# Patient Record
Sex: Female | Born: 1990 | Race: Black or African American | Hispanic: No | Marital: Single | Smoking: Never smoker
Health system: Southern US, Community
[De-identification: ages and names within clinical notes are randomized; demographics above are authoritative.]

## PROBLEM LIST (undated history)

## (undated) DIAGNOSIS — D649 Anemia, unspecified: Secondary | ICD-10-CM

## (undated) DIAGNOSIS — D259 Leiomyoma of uterus, unspecified: Secondary | ICD-10-CM

## (undated) DIAGNOSIS — B977 Papillomavirus as the cause of diseases classified elsewhere: Secondary | ICD-10-CM

## (undated) DIAGNOSIS — O021 Missed abortion: Secondary | ICD-10-CM

## (undated) HISTORY — DX: Leiomyoma of uterus, unspecified: D25.9

## (undated) HISTORY — PX: NO PAST SURGERIES: SHX2092

## (undated) HISTORY — DX: Papillomavirus as the cause of diseases classified elsewhere: B97.7

---
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
## 2011-11-05 ENCOUNTER — Other Ambulatory Visit: Payer: Self-pay | Admitting: Family Medicine

## 2011-11-05 ENCOUNTER — Telehealth: Payer: Self-pay

## 2011-11-05 NOTE — Telephone Encounter (Signed)
[facility] notifying patient that she needs office visit/eval before any medication can be called in.  Unable to refill w/o OV.  CB if questions.

## 2011-11-05 NOTE — Telephone Encounter (Signed)
Pt is requesting refill on Rx for yeast infection.

## 2012-03-26 ENCOUNTER — Other Ambulatory Visit: Payer: Self-pay | Admitting: Family Medicine

## 2012-03-30 ENCOUNTER — Telehealth: Payer: Self-pay

## 2012-03-30 NOTE — Telephone Encounter (Signed)
[facility] called for refill auth for Fluconazol. Please review and call back.

## 2012-03-31 NOTE — Telephone Encounter (Signed)
Should patient have OV first?

## 2012-03-31 NOTE — Telephone Encounter (Signed)
Please advise the patient that she needs and office visit for this.

## 2012-03-31 NOTE — Telephone Encounter (Signed)
Pharmacy notified.

## 2014-10-03 HISTORY — PX: COLONOSCOPY: SHX174

## 2020-08-03 ENCOUNTER — Encounter (HOSPITAL_COMMUNITY): Payer: Self-pay

## 2020-08-03 ENCOUNTER — Emergency Department (HOSPITAL_COMMUNITY)
Admission: EM | Admit: 2020-08-03 | Discharge: 2020-08-03 | Disposition: A | Discharge status: Home / Self Care | Payer: 59 | Attending: Emergency Medicine | Admitting: Emergency Medicine

## 2020-08-03 ENCOUNTER — Other Ambulatory Visit: Payer: Self-pay

## 2020-08-03 DIAGNOSIS — S8002XA Contusion of left knee, initial encounter: Secondary | ICD-10-CM | POA: Insufficient documentation

## 2020-08-03 DIAGNOSIS — Y9301 Activity, walking, marching and hiking: Secondary | ICD-10-CM | POA: Insufficient documentation

## 2020-08-03 DIAGNOSIS — S59902A Unspecified injury of left elbow, initial encounter: Secondary | ICD-10-CM | POA: Diagnosis present

## 2020-08-03 DIAGNOSIS — Y9289 Other specified places as the place of occurrence of the external cause: Secondary | ICD-10-CM | POA: Insufficient documentation

## 2020-08-03 DIAGNOSIS — S0993XA Unspecified injury of face, initial encounter: Secondary | ICD-10-CM | POA: Insufficient documentation

## 2020-08-03 DIAGNOSIS — S5002XA Contusion of left elbow, initial encounter: Secondary | ICD-10-CM | POA: Diagnosis not present
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
## 2022-07-12 DIAGNOSIS — B977 Papillomavirus as the cause of diseases classified elsewhere: Secondary | ICD-10-CM | POA: Insufficient documentation

## 2022-10-26 LAB — OB RESULTS CONSOLE HGB/HCT, BLOOD
HCT: 35 (ref 29–41)
Hemoglobin: 10.5

## 2022-10-26 LAB — HEPATITIS C ANTIBODY: HCV Ab: NEGATIVE

## 2022-10-26 LAB — OB RESULTS CONSOLE ANTIBODY SCREEN: Antibody Screen: NEGATIVE

## 2022-10-26 LAB — OB RESULTS CONSOLE GC/CHLAMYDIA
Chlamydia: NEGATIVE
Neisseria Gonorrhea: NEGATIVE

## 2022-10-26 LAB — OB RESULTS CONSOLE HEPATITIS B SURFACE ANTIGEN: Hepatitis B Surface Ag: NEGATIVE

## 2022-10-26 LAB — OB RESULTS CONSOLE RPR: RPR: NONREACTIVE

## 2022-10-26 LAB — OB RESULTS CONSOLE ABO/RH: RH Type: POSITIVE

## 2022-10-26 LAB — OB RESULTS CONSOLE PLATELET COUNT: Platelets: 192

## 2022-10-26 LAB — OB RESULTS CONSOLE HIV ANTIBODY (ROUTINE TESTING): HIV: NONREACTIVE

## 2022-11-30 ENCOUNTER — Encounter (HOSPITAL_BASED_OUTPATIENT_CLINIC_OR_DEPARTMENT_OTHER): Payer: PRIVATE HEALTH INSURANCE

## 2022-11-30 ENCOUNTER — Encounter: Payer: Self-pay | Admitting: *Deleted

## 2022-11-30 ENCOUNTER — Encounter: Payer: Self-pay | Admitting: Certified Nurse Midwife

## 2022-11-30 LAB — CYTOLOGY - PAP: Pap: NEGATIVE

## 2022-11-30 LAB — HCG, BETA SUBUNIT, QN (SERIAL): hCG,Beta Subunit,Qual,Serum: 12884

## 2022-12-01 ENCOUNTER — Ambulatory Visit (INDEPENDENT_AMBULATORY_CARE_PROVIDER_SITE_OTHER): Payer: Medicaid Other | Admitting: Obstetrics & Gynecology

## 2022-12-01 ENCOUNTER — Encounter (HOSPITAL_BASED_OUTPATIENT_CLINIC_OR_DEPARTMENT_OTHER): Payer: Self-pay

## 2022-12-01 ENCOUNTER — Ambulatory Visit (INDEPENDENT_AMBULATORY_CARE_PROVIDER_SITE_OTHER): Payer: Medicaid Other

## 2022-12-01 ENCOUNTER — Ambulatory Visit (INDEPENDENT_AMBULATORY_CARE_PROVIDER_SITE_OTHER): Payer: Medicaid Other | Admitting: *Deleted

## 2022-12-01 VITALS — BP 106/74 | HR 83 | Ht 68.0 in | Wt 198.2 lb

## 2022-12-01 DIAGNOSIS — D251 Intramural leiomyoma of uterus: Secondary | ICD-10-CM | POA: Diagnosis not present

## 2022-12-01 DIAGNOSIS — Z3401 Encounter for supervision of normal first pregnancy, first trimester: Secondary | ICD-10-CM

## 2022-12-01 DIAGNOSIS — O3680X0 Pregnancy with inconclusive fetal viability, not applicable or unspecified: Secondary | ICD-10-CM | POA: Diagnosis not present

## 2022-12-01 DIAGNOSIS — Z3A10 10 weeks gestation of pregnancy: Secondary | ICD-10-CM

## 2022-12-01 DIAGNOSIS — Z3400 Encounter for supervision of normal first pregnancy, unspecified trimester: Secondary | ICD-10-CM | POA: Insufficient documentation

## 2022-12-01 MED ORDER — BLOOD PRESSURE KIT DEVI: 1.0000 | Freq: Once | 0 refills | Status: AC

## 2022-12-01 MED ORDER — GOJJI WEIGHT SCALE MISC: 1.0000 | 0 refills | Status: AC

## 2022-12-01 NOTE — Progress Notes (Signed)
New OB Intake  Pt presents to office for New OB intake  I explained I am completing New OB Intake today. We discussed EDD of 06/21/23 that is based on LMP of 09/14/22. Pt is G1/P0. I reviewed her allergies, medications, Medical/Surgical/OB history, and appropriate screenings. I informed her of [facility] services. Based on history, this is a low risk pregnancy.  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 Patient given information for [facility] Healthy Baby website for more information about Women[facility] and [place]. Patient is interested in water birth. Offered upcoming OB visit with CNM to discuss further.  MyChart/Babyscripts MyChart access verified. I explained pt will have some visits in office and some virtually. Babyscripts instructions given and order placed. Patient verifies receipt of registration text/e-mail.   Blood Pressure Cuff/Weight Scale Blood pressure cuff ordered for patient to pick-up from [employer]. Explained after first prenatal appt pt will check weekly and document in 76. Patient does not have weight scale; order sent to [place], patient may track weight weekly in Babyscripts.  Anatomy US Explained first scheduled [place] will be around 19 weeks. Anatomy US scheduled for 01/25/23 at 245. Pt notified to arrive at 230.  Labs Discussed Johnsie Cancel genetic screening with patient. Would like both Panorama and Horizon drawn at new OB visit. Routine prenatal labs were done at Dr. Garwin Brothers office   Social Determinants of [place]: Patient denies food insecurity. WIC Referral: Patient is interested in referral to [facility]. Information for setting up [facility] given to patient. Transportation: Patient denies transportation needs. Childcare: Discussed no children allowed at ultrasound appointments.      Blenda Nicely, RN 12/01/2022  2:43 PM

## 2022-12-02 ENCOUNTER — Inpatient Hospital Stay (HOSPITAL_COMMUNITY): Payer: Medicaid Other

## 2022-12-02 ENCOUNTER — Inpatient Hospital Stay (HOSPITAL_COMMUNITY)
Admission: AD | Admit: 2022-12-02 | Discharge: 2022-12-02 | Disposition: A | Discharge status: Home / Self Care | Payer: Medicaid Other | Attending: Obstetrics & Gynecology | Admitting: Obstetrics & Gynecology

## 2022-12-02 DIAGNOSIS — Z3A10 10 weeks gestation of pregnancy: Secondary | ICD-10-CM | POA: Diagnosis not present

## 2022-12-02 DIAGNOSIS — D259 Leiomyoma of uterus, unspecified: Secondary | ICD-10-CM | POA: Insufficient documentation

## 2022-12-02 DIAGNOSIS — O3411 Maternal care for benign tumor of corpus uteri, first trimester: Secondary | ICD-10-CM | POA: Diagnosis present

## 2022-12-02 DIAGNOSIS — O021 Missed abortion: Secondary | ICD-10-CM | POA: Insufficient documentation

## 2022-12-02 HISTORY — DX: Missed abortion: O02.1

## 2022-12-02 LAB — URINALYSIS, ROUTINE W REFLEX MICROSCOPIC
Bilirubin Urine: NEGATIVE
Glucose, UA: NEGATIVE mg/dL
Hgb urine dipstick: NEGATIVE
Ketones, ur: NEGATIVE mg/dL
Leukocytes,Ua: NEGATIVE
Nitrite: NEGATIVE
Protein, ur: NEGATIVE mg/dL
Specific Gravity, Urine: 1.015 (ref 1.005–1.030)
pH: 5 (ref 5.0–8.0)

## 2022-12-02 LAB — WET PREP, GENITAL
Sperm: NONE SEEN
Trich, Wet Prep: NONE SEEN
WBC, Wet Prep HPF POC: <10 (ref <10)
Yeast Wet Prep HPF POC: NONE SEEN

## 2022-12-02 MED ORDER — OXYCODONE-ACETAMINOPHEN 5-325 MG PO TABS: 1.0000 | ORAL_TABLET | Freq: Four times a day (QID) | ORAL | 0 refills | Status: DC | PRN

## 2022-12-02 MED ORDER — IBUPROFEN 800 MG PO TABS: 800.0000 mg | ORAL_TABLET | Freq: Three times a day (TID) | ORAL | 0 refills | Status: AC

## 2022-12-02 NOTE — MAU Note (Signed)
Pt says she has lower abd pain- started today  at  4pm -  Last sex- Mon [facility]- Dr Sabra Heck -  at [facility] -yesterday- U/S- fibroid blocking baby - unable to see FHR -  Plan- is to return on 12-07-2022 for another U/S . No meds - 6/10 pain

## 2022-12-02 NOTE — MAU Provider Note (Signed)
History     CSN: [id number]  Arrival date and time: 12/02/22 1920  Chief Complaint  Patient presents with   Abdominal Pain   HPI  Mackenzie Lee is a 32 y.o. G1P0 at 57w2dwho presents for evaluation of lower abdominal pain. Patient reports cramping across her lower abdomen that started today at 4pm. Patient rates the pain as a 6/10 and has not tried anything for the pain. [facility]'t [facility] because of her fibroid.  She denies any vaginal bleeding and discharge. Denies any constipation, diarrhea or any urinary complaints.  OB History     Gravida  1   Para      Term      Preterm      AB      Living         SAB      IAB      Ectopic      Multiple      Live Births              No past medical history on file.  No past surgical history on file.  Family History  Problem Relation Age of Onset   Cancer Mother 537      breast   Cancer Paternal Grandmother        breast    Social History   Tobacco Use   Smoking status: Never   Smokeless tobacco: Never  Vaping Use   Vaping Use: Never used  Substance Use Topics   Alcohol use: Not Currently   Drug use: Never    Allergies: No Known Allergies  Medications Prior to Admission  Medication Sig Dispense Refill Last Dose   Misc. Devices (GOJJI WEIGHT SCALE) MISC 1 Device by Does not apply route once a week. To monitor weight at home weekly; ICD 10 z34.90 1 each 0    Prenatal Vit-Fe Fumarate-FA (MULTIVITAMIN-PRENATAL) 27-0.8 MG TABS tablet Take 1 tablet by mouth daily at 12 noon.       Review of Systems  Constitutional: Negative.  Negative for fatigue and fever.  HENT: Negative.    Respiratory: Negative.  Negative for shortness of breath.   Cardiovascular: Negative.  Negative for chest pain.  Gastrointestinal:  Positive for abdominal pain. Negative for constipation, diarrhea, nausea and vomiting.  Genitourinary: Negative.  Negative for  dysuria, vaginal bleeding and vaginal discharge.  Neurological: Negative.  Negative for dizziness and headaches.   Physical Exam   Blood pressure 112/70, pulse 84, temperature 98 F (36.7 C), temperature source Oral, resp. rate 16, height '5\' 7"'$  (1.702 m), weight 89.8 kg, last menstrual period 09/14/2022.  Patient Vitals for the past 24 hrs:  BP Temp Temp src Pulse Resp Height Weight  12/02/22 1942 112/70 98 F (36.7 C) Oral 84 16 '5\' 7"'$  (1.702 m) 89.8 kg    Physical Exam Vitals and nursing note reviewed.  Constitutional:      General: She is not in acute distress.    Appearance: She is well-developed.  HENT:     Head: Normocephalic.  Eyes:     Pupils: Pupils are equal, round, and reactive to light.  Cardiovascular:     Rate and Rhythm: Normal rate and regular rhythm.     Heart sounds: Normal heart sounds.  Pulmonary:     Effort: Pulmonary effort is normal. No respiratory distress.     Breath sounds: Normal breath sounds.  Abdominal:  General: Bowel sounds are normal. There is no distension.     Palpations: Abdomen is soft.     Tenderness: There is no abdominal tenderness.  Skin:    General: Skin is warm and dry.  Neurological:     Mental Status: She is alert and oriented to person, place, and time.  Psychiatric:        Mood and Affect: Mood normal.        Behavior: Behavior normal.        Thought Content: Thought content normal.        Judgment: Judgment normal.     MAU Course  Procedures  Results for orders placed or performed during the hospital encounter of 12/02/22 (from the past 24 hour(s))  Wet prep, genital     Status: Abnormal   Collection Time: 12/02/22  8:00 PM  Result Value Ref Range   Yeast Wet Prep HPF POC NONE SEEN NONE SEEN   Trich, Wet Prep NONE SEEN NONE SEEN   Clue Cells Wet Prep HPF POC PRESENT (A) NONE SEEN   WBC, Wet Prep HPF POC <10 <10   Sperm NONE SEEN   Urinalysis, Routine w reflex microscopic -Urine, Clean Catch     Status: Abnormal    Collection Time: 12/02/22  8:02 PM  Result Value Ref Range   Color, Urine YELLOW YELLOW   APPearance HAZY (A) CLEAR   Specific Gravity, Urine 1.015 1.005 - 1.030   pH 5.0 5.0 - 8.0   Glucose, UA NEGATIVE NEGATIVE mg/dL   Hgb urine dipstick NEGATIVE NEGATIVE   Bilirubin Urine NEGATIVE NEGATIVE   Ketones, ur NEGATIVE NEGATIVE mg/dL   Protein, ur NEGATIVE NEGATIVE mg/dL   Nitrite NEGATIVE NEGATIVE   Leukocytes,Ua NEGATIVE NEGATIVE     US OB LESS THAN 14 WEEKS WITH OB TRANSVAGINAL  Result Date: 12/02/2022 CLINICAL DATA:  Abdominal pain EXAM: OBSTETRIC <14 WK [place] AND TRANSVAGINAL OB US TECHNIQUE: Both transabdominal and transvaginal ultrasound examinations were performed for complete evaluation of the gestation as well as the maternal uterus, adnexal regions, and pelvic cul-de-sac. Transvaginal technique was performed to assess early pregnancy. COMPARISON:  None available. FINDINGS: Intrauterine gestational sac: Single Yolk sac:  Not Visualized. Embryo:  Visualized. Cardiac Activity: Not Visualized. Heart Rate: 0  bpm CRL:  31.8 mm   10 w   1 d                  [place] EDC: 06/29/2023 Subchorionic hemorrhage:  None visualized. Maternal uterus/adnexae: The bilateral ovaries appear within normal limits. There is no pelvic free fluid. Anterior uterine fibroid identified measuring 9.1 by 7.5 x 9.2 cm. IMPRESSION: 1. Intrauterine fetal demise measuring 10 weeks 1 day by crown-rump length. 2. Large anterior uterine fibroid measuring 9.2 cm. Electronically Signed   By: Ronney Asters M.D.   On: 12/02/2022 20:48     MDM Labs ordered and reviewed.   CBC, HCG Wet prep and gc/chlamydia O Pos blood type US OB Less 14 weeks with transvaginal  CNM independently reviewed the imaging ordered. Imaging show IUP measuring 10 weeks with no FHR  CNM consulted with Dr. Nelda Marseille regarding presentation and results- MD states CNM can offer cytotec if patient desires but with caution that it will not likely work.   CNM  informed patient of results of ultrasound showing missed AB. Condolences provided and space given for patient to process emotions. CNM discussed options for management including expectant management, cytotec and surgical management. Risks and benefits of each  reviewed at length. Patient desires surgical and verbalized understanding of risks and benefits of this method.   Message sent to scheduler with request for [facility]  Assessment and Plan   1. Missed abortion   2. [redacted] weeks gestation of pregnancy     -Discharge home in stable condition -Rx for percocet and ibuprofen sent to [facility] -Vaginal bleeding and pain precautions discussed -Patient advised to follow-up with [facility] for surgery next week -Patient may return to MAU as needed or if her condition were to change or worsen  Wende Mott, CNM 12/02/2022, 9:01 PM

## 2022-12-04 ENCOUNTER — Encounter (HOSPITAL_BASED_OUTPATIENT_CLINIC_OR_DEPARTMENT_OTHER): Payer: Self-pay | Admitting: Obstetrics & Gynecology

## 2022-12-04 NOTE — Progress Notes (Signed)
GYNECOLOGY  VISIT  CC:   confirmation of pregnancy  HPI: 32 y.o. G1P0 Single Black or [place] American female here for what was scheduled as new ob appt.  Pt has been seen initially by Dr. Garwin Brothers and did have ultrasound around 6 weeks with heart beat.  She was originally being seen for consideration of treatment of fibroid.  Pt reports when ultrasound was done, Dr. Garwin Brothers told her "there was a friend" and that she was pregnant.  No treatment for fibroid could be done although she was considering Sonata.  Denies bleeding.  Had some nausea but that seems to have resolved.  No vaginal bleeding.   Past Medical History:  Diagnosis Date   Fibroid uterus     MEDS:   No current outpatient medications on file prior to visit.   No current facility-administered medications on file prior to visit.    ALLERGIES: Patient has no known allergies.  SH:  non smoker, single  Review of Systems  Constitutional: Negative.   Genitourinary: Negative.     PHYSICAL EXAMINATION:    BP 106/74   Pulse 83   Ht '5\' 8"'$  (1.727 m)   Wt 198 lb 3.2 oz (89.9 kg)   LMP 09/14/2022   BMI 30.14 kg/m     General appearance: alert, cooperative and appears stated age Abdomen: soft, non-tender; bowel sounds normal; no masses,  no organomegaly Lymph:  no inguinal LAD noted  Pelvic: External genitalia:  no lesions              Urethra:  normal appearing urethra with no masses, tenderness or lesions              Bartholins and Skenes: normal                 Vagina: normal appearing vagina with normal color and discharge, no lesions               Bedside ultrasound was performed initially transabdominally.  Large fundal fibroid present.  Gestational sac and fetal pole seen but not well.  Vaginal ultrasound performed.  CRL measured 10 1/7 week.  No FCA noted.  No sound with pulse wave seen.  No evidence of subchorionic hemorrhage.  Ultrasound done in presence of RN, Ezekiel Ina.  Chaperone, Ezekiel Ina, CMA, was  present for exam.  Assessment/Plan: 1. Missed abortion - pt and significant other appropriately upset.  Would like confirmation ultrasound done next week.  This is scheduled for them.  2. Intramural leiomyoma of uterus - large 8+cm fibroid noted on ultrasound today  3.  ASCUS pap with +HR HPV - this is noted in Dr. Harvie Bridge not but I do not have actual results.  Will have pt sign release for pap.  Total time with pt: 29mn.

## 2022-12-05 ENCOUNTER — Encounter (HOSPITAL_BASED_OUTPATIENT_CLINIC_OR_DEPARTMENT_OTHER): Payer: Self-pay | Admitting: Obstetrics and Gynecology

## 2022-12-05 ENCOUNTER — Telehealth: Payer: Self-pay

## 2022-12-05 LAB — GC/CHLAMYDIA PROBE AMP (~~LOC~~) NOT AT ARMC
Chlamydia: NEGATIVE
Comment: NEGATIVE
Comment: NORMAL
Neisseria Gonorrhea: NEGATIVE

## 2022-12-05 NOTE — Progress Notes (Signed)
Spoke w/ via phone for pre-op interview--- pt Lab needs dos----  no             Lab results------ no COVID test -----patient states asymptomatic no test needed Arrive at ------- 0530 on 12-06-2022 NPO after MN  Med rec completed Medications to take morning of surgery ----- none Diabetic medication ----- n/a Patient instructed no nail polish to be worn day of surgery Patient instructed to bring photo id and insurance card day of surgery Patient aware to have Driver (ride ) / caregiver    for 24 hours after surgery -- family, daseer Patient Special Instructions ----- n/a Pre-Op special Istructions ----- case just added on today, orders pending Patient verbalized understanding of instructions that were given at this phone interview. Patient denies shortness of breath, chest pain, fever, cough at this phone interview.

## 2022-12-05 NOTE — Telephone Encounter (Signed)
Called patient, surgery date, time, location, and preop instructions given. Patient expressed understanding.

## 2022-12-05 NOTE — Anesthesia Preprocedure Evaluation (Signed)
Anesthesia Evaluation  Patient identified by MRN, date of birth, ID band Patient awake    Reviewed: Allergy & Precautions, NPO status , Patient's Chart, lab work & pertinent test results  Airway Mallampati: I  TM Distance: >3 FB Neck ROM: Full    Dental no notable dental hx. (+) Teeth Intact, Dental Advisory Given   Pulmonary    Pulmonary exam normal breath sounds clear to auscultation       Cardiovascular negative cardio ROS Normal cardiovascular exam Rhythm:Regular Rate:Normal     Neuro/Psych negative neurological ROS  negative psych ROS   GI/Hepatic Neg liver ROS,,,  Endo/Other  negative endocrine ROS    Renal/GU      Musculoskeletal   Abdominal   Peds  Hematology Lab Results      Component                Value               Date                      HGB                      10.5                10/26/2022                HCT                      35                  10/26/2022                PLT                      192                 10/26/2022              Anesthesia Other Findings   Reproductive/Obstetrics                             Anesthesia Physical Anesthesia Plan  ASA: 2  Anesthesia Plan: General   Post-op Pain Management:    Induction: Intravenous  PONV Risk Score and Plan: Treatment may vary due to age or medical condition, Midazolam and Ondansetron  Airway Management Planned: LMA  Additional Equipment: None  Intra-op Plan:   Post-operative Plan: Extubation in OR  Informed Consent: I have reviewed the patients History and Physical, chart, labs and discussed the procedure including the risks, benefits and alternatives for the proposed anesthesia with the patient or authorized representative who has indicated his/her understanding and acceptance.     Dental advisory given  Plan Discussed with:   Anesthesia Plan Comments: (If <13 wks then LMA GA)        Anesthesia Quick Evaluation

## 2022-12-06 ENCOUNTER — Ambulatory Visit (HOSPITAL_BASED_OUTPATIENT_CLINIC_OR_DEPARTMENT_OTHER): Payer: Medicaid Other | Admitting: Anesthesiology

## 2022-12-06 ENCOUNTER — Other Ambulatory Visit: Payer: Self-pay

## 2022-12-06 ENCOUNTER — Ambulatory Visit (HOSPITAL_BASED_OUTPATIENT_CLINIC_OR_DEPARTMENT_OTHER)
Admission: RE | Admit: 2022-12-06 | Discharge: 2022-12-06 | Disposition: A | Discharge status: Home / Self Care | Payer: Medicaid Other | Attending: Obstetrics and Gynecology | Admitting: Obstetrics and Gynecology

## 2022-12-06 ENCOUNTER — Encounter (HOSPITAL_BASED_OUTPATIENT_CLINIC_OR_DEPARTMENT_OTHER)
Admission: RE | Disposition: A | Discharge status: Home / Self Care | Payer: Self-pay | Source: Home / Self Care | Attending: Obstetrics and Gynecology

## 2022-12-06 ENCOUNTER — Encounter (HOSPITAL_BASED_OUTPATIENT_CLINIC_OR_DEPARTMENT_OTHER): Payer: Self-pay | Admitting: Obstetrics and Gynecology

## 2022-12-06 DIAGNOSIS — O021 Missed abortion: Secondary | ICD-10-CM

## 2022-12-06 DIAGNOSIS — Z3A14 14 weeks gestation of pregnancy: Secondary | ICD-10-CM

## 2022-12-06 DIAGNOSIS — Z3A10 10 weeks gestation of pregnancy: Secondary | ICD-10-CM

## 2022-12-06 DIAGNOSIS — Z01818 Encounter for other preprocedural examination: Secondary | ICD-10-CM

## 2022-12-06 HISTORY — DX: Anemia, unspecified: D64.9

## 2022-12-06 HISTORY — PX: DILATION AND EVACUATION: SHX1459

## 2022-12-06 SURGERY — DILATION AND EVACUATION, UTERUS
Anesthesia: Monitor Anesthesia Care | Site: Uterus

## 2022-12-06 MED ORDER — PROPOFOL 10 MG/ML IV BOLUS
INTRAVENOUS | Status: DC | PRN
  Administered 2022-12-06: 50 mg via INTRAVENOUS
  Administered 2022-12-06: 20 mg via INTRAVENOUS
  Administered 2022-12-06: 200 mg via INTRAVENOUS
  Administered 2022-12-06: 50 mg via INTRAVENOUS

## 2022-12-06 MED ORDER — MIDAZOLAM HCL 2 MG/2ML IJ SOLN
INTRAMUSCULAR | Status: AC
  Filled 2022-12-06: qty 2

## 2022-12-06 MED ORDER — KETOROLAC TROMETHAMINE 30 MG/ML IJ SOLN
INTRAMUSCULAR | Status: AC
  Filled 2022-12-06: qty 1

## 2022-12-06 MED ORDER — FENTANYL CITRATE (PF) 100 MCG/2ML IJ SOLN
INTRAMUSCULAR | Status: AC
  Filled 2022-12-06: qty 2

## 2022-12-06 MED ORDER — LACTATED RINGERS IV SOLN: INTRAVENOUS | Status: DC

## 2022-12-06 MED ORDER — ONDANSETRON HCL 4 MG/2ML IJ SOLN
INTRAMUSCULAR | Status: AC
  Filled 2022-12-06: qty 2

## 2022-12-06 MED ORDER — POVIDONE-IODINE 10 % EX SWAB: 2.0000 | Freq: Once | CUTANEOUS | Status: DC

## 2022-12-06 MED ORDER — HYDROMORPHONE HCL 1 MG/ML IJ SOLN
INTRAMUSCULAR | Status: AC
  Filled 2022-12-06: qty 1

## 2022-12-06 MED ORDER — DEXAMETHASONE SODIUM PHOSPHATE 10 MG/ML IJ SOLN
INTRAMUSCULAR | Status: AC
  Filled 2022-12-06: qty 1

## 2022-12-06 MED ORDER — KETOROLAC TROMETHAMINE 30 MG/ML IJ SOLN: 30.0000 mg | Freq: Once | INTRAMUSCULAR | Status: DC | PRN

## 2022-12-06 MED ORDER — DOXYCYCLINE HYCLATE 100 MG IV SOLR
200.0000 mg | INTRAVENOUS | Status: AC
  Administered 2022-12-06: 200 mg via INTRAVENOUS
  Filled 2022-12-06: qty 200

## 2022-12-06 MED ORDER — PROPOFOL 10 MG/ML IV BOLUS
INTRAVENOUS | Status: AC
  Filled 2022-12-06: qty 20

## 2022-12-06 MED ORDER — ONDANSETRON HCL 4 MG/2ML IJ SOLN: 4.0000 mg | Freq: Once | INTRAMUSCULAR | Status: DC | PRN

## 2022-12-06 MED ORDER — DEXAMETHASONE SODIUM PHOSPHATE 10 MG/ML IJ SOLN
INTRAMUSCULAR | Status: DC | PRN
  Administered 2022-12-06: 10 mg via INTRAVENOUS

## 2022-12-06 MED ORDER — LIDOCAINE-EPINEPHRINE 1 %-1:100000 IJ SOLN
INTRAMUSCULAR | Status: DC | PRN
  Administered 2022-12-06: 20 mL

## 2022-12-06 MED ORDER — KETOROLAC TROMETHAMINE 30 MG/ML IJ SOLN
INTRAMUSCULAR | Status: DC | PRN
  Administered 2022-12-06: 30 mg via INTRAVENOUS

## 2022-12-06 MED ORDER — MIDAZOLAM HCL 5 MG/5ML IJ SOLN
INTRAMUSCULAR | Status: DC | PRN
  Administered 2022-12-06: 2 mg via INTRAVENOUS

## 2022-12-06 MED ORDER — LIDOCAINE 2% (20 MG/ML) 5 ML SYRINGE
INTRAMUSCULAR | Status: DC | PRN
  Administered 2022-12-06: 60 mg via INTRAVENOUS
  Administered 2022-12-06: 100 mg via INTRAVENOUS

## 2022-12-06 MED ORDER — FENTANYL CITRATE (PF) 100 MCG/2ML IJ SOLN
INTRAMUSCULAR | Status: DC | PRN
  Administered 2022-12-06 (×2): 50 ug via INTRAVENOUS

## 2022-12-06 MED ORDER — HYDROMORPHONE HCL 1 MG/ML IJ SOLN
0.2500 mg | INTRAMUSCULAR | Status: DC | PRN
  Administered 2022-12-06 (×2): 0.25 mg via INTRAVENOUS

## 2022-12-06 MED ORDER — OXYCODONE HCL 5 MG PO TABS: 5.0000 mg | ORAL_TABLET | Freq: Once | ORAL | Status: DC | PRN

## 2022-12-06 MED ORDER — ONDANSETRON HCL 4 MG/2ML IJ SOLN
INTRAMUSCULAR | Status: DC | PRN
  Administered 2022-12-06: 4 mg via INTRAVENOUS

## 2022-12-06 MED ORDER — LIDOCAINE HCL (PF) 2 % IJ SOLN
INTRAMUSCULAR | Status: AC
  Filled 2022-12-06: qty 5

## 2022-12-06 MED ORDER — METHYLERGONOVINE MALEATE 0.2 MG/ML IJ SOLN
INTRAMUSCULAR | Status: DC | PRN
  Administered 2022-12-06: .2 mg via INTRAMUSCULAR

## 2022-12-06 MED ORDER — ACETAMINOPHEN 500 MG PO TABS
ORAL_TABLET | ORAL | Status: AC
  Filled 2022-12-06: qty 2

## 2022-12-06 MED ORDER — ACETAMINOPHEN 500 MG PO TABS
1000.0000 mg | ORAL_TABLET | ORAL | Status: AC
  Administered 2022-12-06: 1000 mg via ORAL

## 2022-12-06 MED ORDER — OXYCODONE HCL 5 MG/5ML PO SOLN: 5.0000 mg | Freq: Once | ORAL | Status: DC | PRN

## 2022-12-06 SURGICAL SUPPLY — 24 items
CATH ROBINSON RED A/P 16FR (CATHETERS) ×1 IMPLANT
DRSG TELFA 3X8 NADH STRL (GAUZE/BANDAGES/DRESSINGS) ×1 IMPLANT
FILTER UTR ASPR ASSEMBLY (MISCELLANEOUS) ×1 IMPLANT
GAUZE 4X4 16PLY ~~LOC~~+RFID DBL (SPONGE) ×1 IMPLANT
GLOVE BIO SURGEON STRL SZ7 (GLOVE) ×1 IMPLANT
GLOVE BIOGEL PI IND STRL 7.0 (GLOVE) ×1 IMPLANT
GOWN STRL REUS W/TWL XL LVL3 (GOWN DISPOSABLE) ×1 IMPLANT
HOSE CONNECTING 18IN BERKELEY (TUBING) ×1 IMPLANT
KIT BERKELEY 1ST TRIMESTER 3/8 (MISCELLANEOUS) ×2 IMPLANT
KIT TURNOVER CYSTO (KITS) ×1 IMPLANT
NS IRRIG 500ML POUR BTL (IV SOLUTION) ×1 IMPLANT
PACK VAGINAL MINOR WOMEN LF (CUSTOM PROCEDURE TRAY) ×1 IMPLANT
PAD OB MATERNITY 4.3X12.25 (PERSONAL CARE ITEMS) ×1 IMPLANT
PAD PREP 24X48 CUFFED NSTRL (MISCELLANEOUS) ×1 IMPLANT
SCOPETTES 8  STERILE (MISCELLANEOUS)
SCOPETTES 8 STERILE (MISCELLANEOUS) IMPLANT
SET BERKELEY SUCTION TUBING (SUCTIONS) ×1 IMPLANT
SLEEVE SCD COMPRESS KNEE MED (STOCKING) ×1 IMPLANT
TOWEL OR 17X24 6PK STRL BLUE (TOWEL DISPOSABLE) ×1 IMPLANT
TRAP TISSUE FILTER (MISCELLANEOUS) ×2 IMPLANT
VACURETTE 10 RIGID CVD (CANNULA) IMPLANT
VACURETTE 7MM CVD STRL WRAP (CANNULA) IMPLANT
VACURETTE 8 RIGID CVD (CANNULA) IMPLANT
VACURETTE 9 RIGID CVD (CANNULA) IMPLANT

## 2022-12-06 NOTE — Transfer of Care (Signed)
Immediate Anesthesia Transfer of Care Note  Patient: Mackenzie Lee  Procedure(s) Performed: DILATATION AND EVACUATION WITH CHROMOSOME STUDIES (Uterus)  Patient Location: PACU  Anesthesia Type:General  Level of Consciousness: awake, alert , oriented, and patient cooperative  Airway & Oxygen Therapy: Patient Spontanous Breathing and face mask O2  Post-op Assessment: Report given to RN and Post -op Vital signs reviewed and stable  Post vital signs: Reviewed and stable  Last Vitals:  Vitals Value Taken Time  BP 124/47 12/06/22 0816  Temp 36.6 C 12/06/22 0815  Pulse 98 12/06/22 0819  Resp 31 12/06/22 0819  SpO2 100 % 12/06/22 0819  Vitals shown include unvalidated device data.  Last Pain:  Vitals:   12/06/22 0815  TempSrc:   PainSc: Asleep      Patients Stated Pain Goal: 5 ([id number])  Complications: No notable events documented.

## 2022-12-06 NOTE — Brief Op Note (Signed)
12/06/2022  8:10 AM  PATIENT:  Mackenzie Lee  32 y.o. female  PRE-OPERATIVE DIAGNOSIS:  MAB  POST-OPERATIVE DIAGNOSIS:  MAB  PROCEDURE:  Procedure(s): DILATATION AND EVACUATION WITH CHROMOSOME STUDIES (N/A)  SURGEON:  Surgeon(s) and Role:    Darliss Cheney, MD - Primary  PHYSICIAN ASSISTANT:   ASSISTANTS: none   ANESTHESIA:   general  EBL:  250 mL   BLOOD ADMINISTERED:none  DRAINS: none   LOCAL MEDICATIONS USED:  LIDOCAINE with epinephrine  SPECIMEN:  Source of Specimen:  products of conception  DISPOSITION OF SPECIMEN:   pathology and anora testing  COUNTS:  YES  TOURNIQUET:  * No tourniquets in log *  DICTATION: .Note written in EPIC  PLAN OF CARE: Discharge to home after PACU  PATIENT DISPOSITION:  PACU - hemodynamically stable.   Delay start of Pharmacological VTE agent (>24hrs) due to surgical blood loss or risk of bleeding: not applicable

## 2022-12-06 NOTE — Anesthesia Postprocedure Evaluation (Signed)
Anesthesia Post Note  Patient: Mackenzie Lee  Procedure(s) Performed: DILATATION AND EVACUATION WITH CHROMOSOME STUDIES (Uterus)     Patient location during evaluation: PACU Anesthesia Type: General Level of consciousness: awake and alert Pain management: pain level controlled Vital Signs Assessment: post-procedure vital signs reviewed and stable Respiratory status: spontaneous breathing, nonlabored ventilation, respiratory function stable and patient connected to nasal cannula oxygen Cardiovascular status: blood pressure returned to baseline and stable Postop Assessment: no apparent nausea or vomiting Anesthetic complications: no  No notable events documented.  Last Vitals:  Vitals:   12/06/22 0845 12/06/22 0916  BP: (!) 124/54 113/71  Pulse:  71  Resp:  17  Temp: 36.6 C 36.6 C  SpO2:  100%    Last Pain:  Vitals:   12/06/22 0916  TempSrc:   PainSc: 3                  Barnet Glasgow

## 2022-12-06 NOTE — H&P (Signed)
OB/GYN Pre-Op History and Physical  Dedra L Mairs is a 32 y.o. G1P0 presenting for dilation and evacuation for missed abortion.       Past Medical History:  Diagnosis Date   Anemia    Fibroid uterus    High risk HPV infection    Missed ab 12/02/2022    Past Surgical History:  Procedure Laterality Date   COLONOSCOPY  2016   NO PAST SURGERIES      OB History  Gravida Para Term Preterm AB Living  1            SAB IAB Ectopic Multiple Live Births               # Outcome Date GA Lbr Len/2nd Weight Sex Delivery Anes PTL Lv  1 Current             Social History   Socioeconomic History   Marital status: Single    Spouse name: Not on file   Number of children: Not on file   Years of education: Not on file   Highest education level: Not on file  Occupational History   Not on file  Tobacco Use   Smoking status: Never   Smokeless tobacco: Never  Vaping Use   Vaping Use: Never used  Substance and Sexual Activity   Alcohol use: Not Currently   Drug use: Never   Sexual activity: Yes    Birth control/protection: None  Other Topics Concern   Not on file  Social History Narrative   Not on file   Social Determinants of Health   Financial Resource Strain: Low Risk  (12/01/2022)   Overall Financial Resource Strain (CARDIA)    Difficulty of Paying Living Expenses: Not very hard  Food Insecurity: No Food Insecurity (12/01/2022)   Hunger Vital Sign    Worried About Running Out of Food in the Last Year: Never true    Ran Out of Food in the Last Year: Never true  Transportation Needs: No Transportation Needs (12/01/2022)   PRAPARE - Hydrologist (Medical): No    Lack of Transportation (Non-Medical): No  Physical Activity: Sufficiently Active (12/01/2022)   Exercise Vital Sign    Days of Exercise per Week: 3 days    Minutes of Exercise per Session: 60 min  Stress: No Stress Concern Present (12/01/2022)   [place]    Feeling of Stress : Not at all  Social Connections: Moderately Isolated (12/01/2022)   Social Connection and Isolation Panel [NHANES]    Frequency of Communication with Friends and Family: Twice a week    Frequency of Social Gatherings with Friends and Family: Three times a week    Attends Religious Services: 1 to 4 times per year    Active Member of Clubs or Organizations: No    Attends Archivist Meetings: Never    Marital Status: Never married    Family History  Problem Relation Age of Onset   Cancer Mother 16       breast   Cancer Paternal Grandmother        breast    Medications Prior to Admission  Medication Sig Dispense Refill Last Dose   Prenatal Vit-Fe Fumarate-FA (MULTIVITAMIN-PRENATAL) 27-0.8 MG TABS tablet Take 1 tablet by mouth daily at 12 noon.   Past Week   ibuprofen (ADVIL) 800 MG tablet Take 1 tablet (800 mg total) by mouth 3 (three) times daily.  30 tablet 0 More than a month   Misc. Devices (GOJJI WEIGHT SCALE) MISC 1 Device by Does not apply route once a week. To monitor weight at home weekly; ICD 10 z34.90 1 each 0    oxyCODONE-acetaminophen (PERCOCET/ROXICET) 5-325 MG tablet Take 1 tablet by mouth every 6 (six) hours as needed for severe pain. 20 tablet 0 Unknown    No Known Allergies  Review of Systems: Negative except for what is mentioned in HPI.     Physical Exam: BP 126/65   Pulse 63   Temp 98.3 F (36.8 C) (Oral)   Resp 17   Ht '5\' 8"'$  (1.727 m)   Wt 90.5 kg   LMP 09/14/2022   SpO2 98%   BMI 30.35 kg/m  CONSTITUTIONAL: Well-developed, well-nourished female in no acute distress.  HENT:  Normocephalic, atraumatic, External right and left ear normal. Oropharynx is clear and moist EYES: Conjunctivae and EOM are normal. Pupils are equal, round, and reactive to light. No scleral icterus.  NECK: Normal range of motion, supple, no masses SKIN: Skin is warm and dry. No rash noted. Not diaphoretic.  No erythema. No pallor. [place]: Alert and oriented to person, place, and time. Normal reflexes, muscle tone coordination. No cranial nerve deficit noted. PSYCHIATRIC: Normal mood and affect. Normal behavior. Normal judgment and thought content. RESPIRATORY: Normal Effort no problems with respiration noted PELVIC: Deferred   Pertinent Labs/Studies:   No results found for this or any previous visit (from the past 72 hour(s)).     Assessment and Plan :FLORENTINA BURGESON is a 32 y.o. G1P0 here for D&E with ANORA.   Plan for d&e for missed abortion NPO preop labs ordered VS Q4    Darliss Cheney, M.D. Minimally Invasive Gynecologic Surgery and Pelvic Pain Specialist Attending [place], [place] for [employer], [place]

## 2022-12-06 NOTE — Anesthesia Procedure Notes (Signed)
Procedure Name: LMA Insertion Date/Time: 12/06/2022 7:34 AM  Performed by: Rogers Blocker, CRNAPre-anesthesia Checklist: Patient identified, Emergency Drugs available, Suction available and Patient being monitored Patient Re-evaluated:Patient Re-evaluated prior to induction Oxygen Delivery Method: Circle System Utilized Preoxygenation: Pre-oxygenation with 100% oxygen Induction Type: IV induction Ventilation: Mask ventilation without difficulty LMA: LMA inserted LMA Size: 4.0 Number of attempts: 1 Airway Equipment and Method: Bite block Placement Confirmation: positive ETCO2 Tube secured with: Tape Dental Injury: Teeth and Oropharynx as per pre-operative assessment

## 2022-12-06 NOTE — Discharge Instructions (Addendum)
Take ibuprofen every 6 hours for at least the first 24 hours to help with cramping. We will follow up pathology, genetic testing and fibroid management at your postop appointment   If you are heavy bleeding (soaking a pad in an hour for 2 hours), have fever, chills, pain that does not improve with ibuprofen, or abnormal vaginal discharge/tissue, please present for evaluation at the emergency room   No acetaminophen/Tylenol until after 11:45pm today if needed for pain.   No ibuprofen, Advil, Aleve, Motrin, ketorolac, meloxicam, naproxen, or other NSAIDS until after 2:00pm today if needed for pain.      Post Anesthesia Home Care Instructions  Activity: Get plenty of rest for the remainder of the day. A responsible individual must stay with you for 24 hours following the procedure.  For the next 24 hours, DO NOT: -Drive a car -Paediatric nurse -Drink alcoholic beverages -Take any medication unless instructed by your physician -Make any legal decisions or sign important papers.  Meals: Start with liquid foods such as gelatin or soup. Progress to regular foods as tolerated. Avoid greasy, spicy, heavy foods. If nausea and/or vomiting occur, drink only clear liquids until the nausea and/or vomiting subsides. Call your physician if vomiting continues.  Special Instructions/Symptoms: Your throat may feel dry or sore from the anesthesia or the breathing tube placed in your throat during surgery. If this causes discomfort, gargle with warm salt water. The discomfort should disappear within 24 hours.

## 2022-12-06 NOTE — Op Note (Signed)
Shaniya L Dom PROCEDURE DATE: 12/06/2022  PREOPERATIVE DIAGNOSIS: 10 week missed abortion POSTOPERATIVE DIAGNOSIS: The same PROCEDURE:     Dilation and Evacuation SURGEON:  Dr. Darliss Cheney  INDICATIONS: 32 y.o. G1P0 with MAB at [redacted] weeks gestation, needing surgical completion.  Risks of surgery were discussed with the patient including but not limited to: bleeding which may require transfusion; infection which may require antibiotics; injury to uterus or surrounding organs; need for additional procedures including laparotomy or laparoscopy; possibility of intrauterine scarring which may impair future fertility; and other postoperative/anesthesia complications. Written informed consent was obtained.    FINDINGS:  A 14 week size uterus, moderate amounts of products of conception, specimen sent to pathology.  ANESTHESIA: General, paracervical block. INTRAVENOUS FLUIDS:  400 ml of LR ESTIMATED BLOOD LOSS:  Less than 250 ml. SPECIMENS:  Products of conception sent to pathology and some products of conception were sent for [facility] genetic analysis COMPLICATIONS:  None immediate.  PROCEDURE DETAILS:  The patient received intravenous Doxycycline while in the preoperative area.  She was then taken to the operating room where general anesthesia was administered and was found to be adequate.  After an adequate timeout was performed, she was placed in the dorsal lithotomy position and examined; then prepped and draped in the sterile manner.   A vaginal speculum was then placed in the patient's vagina and a single tooth tenaculum was applied to the anterior lip of the cervix.  A paracervical block using 20 ml of 0.5% lidocaine with epinephrine was administered. The cervix was gently dilated to accommodate a 9 mm suction curette that was gently advanced to the uterine fundus. The suction device was then activated and curette slowly rotated to clear the uterus of products of conception.  Suction curettage was  done until complete emptying of the uterus was confirmed. There was moderate bleeding and so the curette was changed to an 81m and remaining products and blood were evacuated and methergine given for additional uterine contraction given the large anterior fibroid. There was minimal bleeding noted and the tenaculum removed with good hemostasis noted.   All instruments were removed from the patient's vagina.  Sponge and instrument counts were correct times two  The patient tolerated the procedure well and was taken to the recovery area extubated, awake, and in stable condition.  The patient will be discharged to home as per PACU criteria.   She will follow up in the office in 2-3 weeks for postoperative evaluation  CDarliss Cheney MD [place] [facility]for [facility] [place]

## 2022-12-07 ENCOUNTER — Encounter (HOSPITAL_BASED_OUTPATIENT_CLINIC_OR_DEPARTMENT_OTHER): Payer: Self-pay | Admitting: Obstetrics and Gynecology

## 2022-12-07 LAB — SURGICAL PATHOLOGY

## 2022-12-15 ENCOUNTER — Encounter (HOSPITAL_BASED_OUTPATIENT_CLINIC_OR_DEPARTMENT_OTHER): Payer: Self-pay | Admitting: Obstetrics and Gynecology

## 2022-12-20 ENCOUNTER — Ambulatory Visit (HOSPITAL_BASED_OUTPATIENT_CLINIC_OR_DEPARTMENT_OTHER): Payer: Medicaid Other | Admitting: Obstetrics and Gynecology

## 2022-12-20 ENCOUNTER — Encounter (HOSPITAL_BASED_OUTPATIENT_CLINIC_OR_DEPARTMENT_OTHER): Payer: Self-pay

## 2022-12-22 ENCOUNTER — Ambulatory Visit (INDEPENDENT_AMBULATORY_CARE_PROVIDER_SITE_OTHER): Payer: Medicaid Other | Admitting: Obstetrics & Gynecology

## 2022-12-22 ENCOUNTER — Encounter (HOSPITAL_BASED_OUTPATIENT_CLINIC_OR_DEPARTMENT_OTHER): Payer: Self-pay | Admitting: Obstetrics & Gynecology

## 2022-12-22 VITALS — BP 127/70 | HR 75 | Ht 68.0 in | Wt 198.4 lb

## 2022-12-22 DIAGNOSIS — D251 Intramural leiomyoma of uterus: Secondary | ICD-10-CM

## 2022-12-22 DIAGNOSIS — Z8759 Personal history of other complications of pregnancy, childbirth and the puerperium: Secondary | ICD-10-CM | POA: Diagnosis not present

## 2022-12-25 ENCOUNTER — Encounter (HOSPITAL_BASED_OUTPATIENT_CLINIC_OR_DEPARTMENT_OTHER): Payer: Self-pay | Admitting: Obstetrics & Gynecology

## 2022-12-25 DIAGNOSIS — Z8759 Personal history of other complications of pregnancy, childbirth and the puerperium: Secondary | ICD-10-CM | POA: Insufficient documentation

## 2022-12-25 DIAGNOSIS — D259 Leiomyoma of uterus, unspecified: Secondary | ICD-10-CM | POA: Insufficient documentation

## 2022-12-25 NOTE — Progress Notes (Addendum)
GYNECOLOGY  VISIT  CC:   follow up after miscarriage  HPI: 32 y.o. G1P0 Single Black or [place] American female here for follow up after miscarriage.  Doing well form surgical standpoint.  Bleeding is minimal.  She did have Anora testing done.  I do not have results for this but she reports it was negative for abnormal chromosomes.  Given this result, I do think she should consider treatment for her fibroid.  She would like Sonata treatment.  I have not had great success with larger fibroids and feel myomectomy is better route.  She understands she would need cesarean sections in the future if she did have a myomectomy.  She really does not want this.  I feel uncomfortable with the Sonata but can reach out and see who would feel comfortable with this from an experience standpoint and also who accepts her insurance.  Her significant other is with her today.  All questions answered.   Past Medical History:  Diagnosis Date   Anemia    Fibroid uterus    High risk HPV infection    Missed ab 12/02/2022    MEDS:   Current Outpatient Medications on File Prior to Visit  Medication Sig Dispense Refill   ibuprofen (ADVIL) 800 MG tablet Take 1 tablet (800 mg total) by mouth 3 (three) times daily. 30 tablet 0   Misc. Devices (GOJJI WEIGHT SCALE) MISC 1 Device by Does not apply route once a week. To monitor weight at home weekly; ICD 10 z34.90 1 each 0   No current facility-administered medications on file prior to visit.    ALLERGIES: Patient has no known allergies.  SH:  partnered, non smoker  Review of Systems  Constitutional: Negative.   Genitourinary: Negative.     PHYSICAL EXAMINATION:    BP 127/70 (BP Location: Right Arm, Patient Position: Sitting, Cuff Size: Large)   Pulse 75   Ht 5\' 8"  (1.727 m) Comment: Reported  Wt 198 lb 6.4 oz (90 kg)   LMP 09/14/2022   BMI 30.17 kg/m     General appearance: alert, cooperative and appears stated age   Assessment/Plan: 1. Intramural  leiomyoma of uterus - discussion about treatment options discussed today.  She would really like to pursue Sonata treatment so will do some research and get back with her about options - will repeat ultrasound after next menstrual cycle to see if this is closer to 5cm (which was prior to pregnancy) or 7cm (when first trimester imaging was done).    2. History of miscarriage  Total time with pt in face to face discussion which was mostly spent on discussion of fibroid size and treatment:  23 mintues

## 2022-12-29 ENCOUNTER — Encounter (HOSPITAL_BASED_OUTPATIENT_CLINIC_OR_DEPARTMENT_OTHER): Payer: Medicaid Other | Admitting: Obstetrics & Gynecology

## 2022-12-29 ENCOUNTER — Other Ambulatory Visit (HOSPITAL_BASED_OUTPATIENT_CLINIC_OR_DEPARTMENT_OTHER): Payer: Self-pay | Admitting: Obstetrics & Gynecology

## 2022-12-29 DIAGNOSIS — D251 Intramural leiomyoma of uterus: Secondary | ICD-10-CM

## 2022-12-29 DIAGNOSIS — Z8759 Personal history of other complications of pregnancy, childbirth and the puerperium: Secondary | ICD-10-CM

## 2023-01-04 ENCOUNTER — Telehealth: Payer: PRIVATE HEALTH INSURANCE

## 2023-01-11 ENCOUNTER — Encounter: Payer: Self-pay | Admitting: Certified Nurse Midwife

## 2023-01-25 ENCOUNTER — Other Ambulatory Visit: Payer: Medicaid Other

## 2023-01-25 ENCOUNTER — Ambulatory Visit: Payer: Medicaid Other

## 2023-02-01 ENCOUNTER — Encounter (HOSPITAL_BASED_OUTPATIENT_CLINIC_OR_DEPARTMENT_OTHER): Payer: Medicaid Other | Admitting: Obstetrics & Gynecology

## 2023-02-16 ENCOUNTER — Encounter (HOSPITAL_BASED_OUTPATIENT_CLINIC_OR_DEPARTMENT_OTHER): Payer: Self-pay | Admitting: *Deleted

## 2023-02-23 ENCOUNTER — Encounter (HOSPITAL_BASED_OUTPATIENT_CLINIC_OR_DEPARTMENT_OTHER): Payer: Medicaid Other | Admitting: Obstetrics & Gynecology

## 2023-03-22 ENCOUNTER — Encounter (HOSPITAL_BASED_OUTPATIENT_CLINIC_OR_DEPARTMENT_OTHER): Payer: Medicaid Other | Admitting: Obstetrics & Gynecology

## 2023-04-12 ENCOUNTER — Encounter (HOSPITAL_BASED_OUTPATIENT_CLINIC_OR_DEPARTMENT_OTHER): Payer: Medicaid Other | Admitting: Obstetrics & Gynecology

## 2023-04-27 ENCOUNTER — Encounter (HOSPITAL_BASED_OUTPATIENT_CLINIC_OR_DEPARTMENT_OTHER): Payer: Medicaid Other | Admitting: Obstetrics & Gynecology

## 2023-05-11 ENCOUNTER — Encounter (HOSPITAL_BASED_OUTPATIENT_CLINIC_OR_DEPARTMENT_OTHER): Payer: Medicaid Other | Admitting: Obstetrics & Gynecology

## 2023-05-25 ENCOUNTER — Encounter (HOSPITAL_BASED_OUTPATIENT_CLINIC_OR_DEPARTMENT_OTHER): Payer: Medicaid Other | Admitting: Obstetrics & Gynecology

## 2023-06-01 ENCOUNTER — Encounter (HOSPITAL_BASED_OUTPATIENT_CLINIC_OR_DEPARTMENT_OTHER): Payer: Medicaid Other | Admitting: Obstetrics & Gynecology

## 2023-06-08 ENCOUNTER — Encounter (HOSPITAL_BASED_OUTPATIENT_CLINIC_OR_DEPARTMENT_OTHER): Payer: Medicaid Other | Admitting: Obstetrics & Gynecology

## 2023-06-15 ENCOUNTER — Encounter (HOSPITAL_BASED_OUTPATIENT_CLINIC_OR_DEPARTMENT_OTHER): Payer: Medicaid Other | Admitting: Obstetrics & Gynecology

## 2023-07-24 ENCOUNTER — Other Ambulatory Visit (HOSPITAL_COMMUNITY)
Admission: RE | Admit: 2023-07-24 | Discharge: 2023-07-24 | Disposition: A | Discharge status: Home / Self Care | Payer: 59 | Source: Ambulatory Visit | Attending: Certified Nurse Midwife | Admitting: Certified Nurse Midwife

## 2023-07-24 ENCOUNTER — Encounter (HOSPITAL_BASED_OUTPATIENT_CLINIC_OR_DEPARTMENT_OTHER): Payer: Self-pay | Admitting: Certified Nurse Midwife

## 2023-07-24 ENCOUNTER — Ambulatory Visit (INDEPENDENT_AMBULATORY_CARE_PROVIDER_SITE_OTHER): Payer: 59 | Admitting: Certified Nurse Midwife

## 2023-07-24 VITALS — BP 110/71 | HR 72 | Ht 68.0 in | Wt 188.4 lb

## 2023-07-24 DIAGNOSIS — Z8759 Personal history of other complications of pregnancy, childbirth and the puerperium: Secondary | ICD-10-CM

## 2023-07-24 DIAGNOSIS — B977 Papillomavirus as the cause of diseases classified elsewhere: Secondary | ICD-10-CM

## 2023-07-24 DIAGNOSIS — Z01419 Encounter for gynecological examination (general) (routine) without abnormal findings: Secondary | ICD-10-CM

## 2023-07-24 DIAGNOSIS — Z113 Encounter for screening for infections with a predominantly sexual mode of transmission: Secondary | ICD-10-CM

## 2023-07-24 DIAGNOSIS — D251 Intramural leiomyoma of uterus: Secondary | ICD-10-CM

## 2023-07-24 DIAGNOSIS — Z124 Encounter for screening for malignant neoplasm of cervix: Secondary | ICD-10-CM | POA: Diagnosis present

## 2023-07-24 DIAGNOSIS — Z803 Family history of malignant neoplasm of breast: Secondary | ICD-10-CM

## 2023-07-24 NOTE — Progress Notes (Signed)
32 y.o. G53P0010 Black or [place] American female here for annual exam. They experienced a spontaneous miscarriage 12/02/22 at [redacted]w[redacted]d requiring D&C. Her period returned in April and has remained monthly and normal since that time. She denies menorrhagia or dysmenorhea, but has known hx large uterine fibroid (?9cm). 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 He is a Naval architect, which sometimes limits their ability to have intercourse during ovulation. They would like to conceive again. She is taking PNV. Still grieving the loss of her son at 10 weeks but seems to have a good support system. She isn't taking any  medication. Does not smoke.   Patient's last menstrual period was 07/05/2023 (exact date).          Sexually active: Yes.    The current method of family planning is none.     Upstream - 07/24/23 0819       Pregnancy Intention Screening   Does the patient want to become pregnant in the next year? Yes    Does the patient's partner want to become pregnant in the next year? Yes    Would the patient like to discuss contraceptive options today? No            The pregnancy intention screening data noted above was reviewed. Potential methods of contraception were discussed. The patient elected to proceed with No data recorded.  Exercising: Yes.     Smoker:  no  Health Maintenance: Pap:  07/2022 Neg with High Risk HPV+, repeat pap today History of abnormal Pap:  no Hx Breast Cancer in Patient Mother MMG:  States she had mammogram x one which was normal Screening Labs: Pt requests screening labs and routine STI labs today   reports that she has never smoked. She has never used smokeless tobacco. She reports that she does not currently use alcohol. She reports that she does not use drugs.  Past Medical History:  Diagnosis Date   Anemia    Fibroid uterus    High risk HPV infection    Missed ab 12/02/2022    Past Surgical History:  Procedure Laterality Date   COLONOSCOPY   2016   DILATION AND EVACUATION N/A 12/06/2022   Procedure: DILATATION AND EVACUATION WITH CHROMOSOME STUDIES;  Surgeon: Lorriane Shire, MD;  Location: [facility];  Service: Gynecology;  Laterality: N/A;   NO PAST SURGERIES      Current Outpatient Medications  Medication Sig Dispense Refill   ibuprofen (ADVIL) 800 MG tablet Take 1 tablet (800 mg total) by mouth 3 (three) times daily. (Patient not taking: Reported on 07/24/2023) 30 tablet 0   Misc. Devices (GOJJI WEIGHT SCALE) MISC 1 Device by Does not apply route once a week. To monitor weight at home weekly; ICD 10 z34.90 (Patient not taking: Reported on 07/24/2023) 1 each 0   No current facility-administered medications for this visit.    Family History  Problem Relation Age of Onset   Cancer Mother 20       breast   Cancer Paternal Grandmother        breast    ROS: Constitutional: negative Genitourinary:negative  Exam:   BP 110/71 (BP Location: Left Arm, Patient Position: Sitting, Cuff Size: Normal)   Pulse 72   Ht 5\' 8"  (1.727 m)   Wt 188 lb 6.4 oz (85.5 kg)   LMP 07/05/2023 (Exact Date)   BMI 28.65 kg/m   Height: 5\' 8"  (172.7 cm)  General appearance: alert, cooperative and appears stated age Head:  Normocephalic, without obvious abnormality, atraumatic Neck: no adenopathy, supple, symmetrical, trachea midline and thyroid normal to inspection and palpation Lungs: clear to auscultation bilaterally Breasts: normal appearance, no masses or tenderness, Inspection negative, No nipple retraction or dimpling, No nipple discharge or bleeding, No axillary or supraclavicular adenopathy, Normal to palpation without dominant masses Heart: regular rate and rhythm Abdomen: soft, non-tender; bowel sounds normal; no masses,  no organomegaly Extremities: extremities normal, atraumatic, no cyanosis or edema Skin: Skin color, texture, turgor normal. No rashes or lesions Lymph nodes: Cervical, supraclavicular, and axillary  nodes normal. No abnormal inguinal nodes palpated Neurologic: Grossly normal   Pelvic: External genitalia:  no lesions              Urethra:  normal appearing urethra with no masses, tenderness or lesions              Bartholins and Skenes: normal                 Vagina: normal appearing vagina with normal color and no discharge, no lesions              Cervix: no bleeding following Pap, no cervical motion tenderness, no lesions, and nulliparous appearance              Pap taken: Yes.   Bimanual Exam:  Uterus:   uterus is enlarged, known uterine fibroid              Adnexa:  will assess by [place]               Anus:  normal sphincter tone, no lesions  Chaperone, Hendricks Milo, CMA, was present for exam.  Assessment/Plan:  1. Encounter for annual routine gynecological examination -Follow-up pap smear collected -Pt requested routine labs and routine STI labs - CBC - Comp Met (CMET) - TSH - Lipid panel - Hemoglobin A1c - Vitamin D, 25-Hydroxy, Total - HIV antibody (with reflex) - RPR - Hepatitis B Surface AntiGEN - Hepatitis C Antibody  2. Routine screening for STI (sexually transmitted infection) - HIV antibody (with reflex) - RPR - Hepatitis B Surface AntiGEN - Hepatitis C Antibody  3. High risk HPV infection -Repeat pap today with repeat HPV Co-Testing - Cytology - PAP( [place])  4. Screening for cervical cancer - Cytology - PAP( [place])  5. HPV in female -Follow-up pap today  6. History of miscarriage -Pt taking prenatal vitamins  7. Intramural leiomyoma of uterus -Follow-up Pelvic GYN [place] scheduled to monitor uterine fibroid .  8. Family Hx Breast Cancer in Pt Mother -Pt will check with Mom and see if genetic screening was completed.  RTO in one year for annual gyn exam and prn if issues arise or call with positive urine pregnancy test.  Letta Kocher

## 2023-07-26 ENCOUNTER — Ambulatory Visit (HOSPITAL_BASED_OUTPATIENT_CLINIC_OR_DEPARTMENT_OTHER): Payer: 59 | Admitting: Certified Nurse Midwife

## 2023-07-26 ENCOUNTER — Other Ambulatory Visit (HOSPITAL_BASED_OUTPATIENT_CLINIC_OR_DEPARTMENT_OTHER): Payer: 59

## 2023-07-27 LAB — CYTOLOGY - PAP
Chlamydia: NEGATIVE
Comment: NEGATIVE
Comment: NEGATIVE
Comment: NEGATIVE
Comment: NORMAL
Diagnosis: NEGATIVE
High risk HPV: NEGATIVE
Neisseria Gonorrhea: NEGATIVE
Trichomonas: NEGATIVE

## 2023-08-01 LAB — VITAMIN D, 25-HYDROXY, TOTAL: Vitamin D, 25-Hydroxy, Serum: 26 ng/mL — ABNORMAL LOW

## 2023-08-01 LAB — COMPREHENSIVE METABOLIC PANEL
ALT: 20 [IU]/L (ref 0–32)
AST: 23 [IU]/L (ref 0–40)
Albumin: 4 g/dL (ref 3.9–4.9)
Alkaline Phosphatase: 92 [IU]/L (ref 44–121)
BUN/Creatinine Ratio: 10 (ref 9–23)
BUN: 8 mg/dL (ref 6–20)
Bilirubin Total: 0.2 mg/dL (ref 0.0–1.2)
CO2: 21 mmol/L (ref 20–29)
Calcium: 9.8 mg/dL (ref 8.7–10.2)
Chloride: 102 mmol/L (ref 96–106)
Creatinine, Ser: 0.79 mg/dL (ref 0.57–1.00)
Globulin, Total: 3 g/dL (ref 1.5–4.5)
Glucose: 79 mg/dL (ref 70–99)
Potassium: 4.3 mmol/L (ref 3.5–5.2)
Sodium: 138 mmol/L (ref 134–144)
Total Protein: 7 g/dL (ref 6.0–8.5)
eGFR: 101 mL/min/{1.73_m2} (ref >59)

## 2023-08-01 LAB — LIPID PANEL
Chol/HDL Ratio: 2.8 ratio (ref 0.0–4.4)
Cholesterol, Total: 169 mg/dL (ref 100–199)
HDL: 60 mg/dL (ref >39)
LDL Chol Calc (NIH): 97 mg/dL (ref 0–99)
Triglycerides: 59 mg/dL (ref 0–149)
VLDL Cholesterol Cal: 12 mg/dL (ref 5–40)

## 2023-08-01 LAB — CBC
Hematocrit: 36 % (ref 34.0–46.6)
Hemoglobin: 10.6 g/dL — ABNORMAL LOW (ref 11.1–15.9)
MCH: 21.1 pg — ABNORMAL LOW (ref 26.6–33.0)
MCHC: 29.4 g/dL — ABNORMAL LOW (ref 31.5–35.7)
MCV: 72 fL — ABNORMAL LOW (ref 79–97)
Platelets: 427 10*3/uL (ref 150–450)
RBC: 5.02 x10E6/uL (ref 3.77–5.28)
RDW: 17.9 % — ABNORMAL HIGH (ref 11.7–15.4)
WBC: 6.2 10*3/uL (ref 3.4–10.8)

## 2023-08-01 LAB — HEPATITIS B SURFACE ANTIGEN: Hepatitis B Surface Ag: NEGATIVE

## 2023-08-01 LAB — HEMOGLOBIN A1C
Est. average glucose Bld gHb Est-mCnc: 103 mg/dL
Hgb A1c MFr Bld: 5.2 % (ref 4.8–5.6)

## 2023-08-01 LAB — RPR: RPR Ser Ql: NONREACTIVE

## 2023-08-01 LAB — HIV ANTIBODY (ROUTINE TESTING W REFLEX): HIV Screen 4th Generation wRfx: NONREACTIVE

## 2023-08-01 LAB — TSH: TSH: 1.4 u[IU]/mL (ref 0.450–4.500)

## 2023-08-01 LAB — HEPATITIS C ANTIBODY: Hep C Virus Ab: NONREACTIVE

## 2023-08-16 ENCOUNTER — Ambulatory Visit (HOSPITAL_BASED_OUTPATIENT_CLINIC_OR_DEPARTMENT_OTHER): Payer: 59

## 2023-09-20 ENCOUNTER — Other Ambulatory Visit (HOSPITAL_BASED_OUTPATIENT_CLINIC_OR_DEPARTMENT_OTHER): Payer: Self-pay | Admitting: Certified Nurse Midwife

## 2023-09-20 DIAGNOSIS — D259 Leiomyoma of uterus, unspecified: Secondary | ICD-10-CM

## 2023-09-21 ENCOUNTER — Ambulatory Visit (HOSPITAL_BASED_OUTPATIENT_CLINIC_OR_DEPARTMENT_OTHER)
Admission: RE | Admit: 2023-09-21 | Discharge: 2023-09-21 | Disposition: A | Discharge status: Home / Self Care | Payer: 59 | Source: Ambulatory Visit | Attending: Certified Nurse Midwife | Admitting: Certified Nurse Midwife

## 2023-09-21 DIAGNOSIS — D259 Leiomyoma of uterus, unspecified: Secondary | ICD-10-CM | POA: Insufficient documentation

## 2023-10-11 ENCOUNTER — Ambulatory Visit (HOSPITAL_BASED_OUTPATIENT_CLINIC_OR_DEPARTMENT_OTHER): Payer: 59 | Admitting: Certified Nurse Midwife

## 2023-10-11 ENCOUNTER — Other Ambulatory Visit (HOSPITAL_BASED_OUTPATIENT_CLINIC_OR_DEPARTMENT_OTHER): Payer: 59
